# Patient Record
Sex: Male | Born: 1980 | Hispanic: Yes | Marital: Married | Smoking: Never smoker
Health system: Southern US, Community
[De-identification: ages and names within clinical notes are randomized; demographics above are authoritative.]

---

## 2016-08-10 ENCOUNTER — Ambulatory Visit (HOSPITAL_COMMUNITY)
Admission: EM | Admit: 2016-08-10 | Discharge: 2016-08-10 | Disposition: A | Discharge status: Home / Self Care | Payer: Self-pay | Attending: Emergency Medicine | Admitting: Emergency Medicine

## 2016-08-10 ENCOUNTER — Ambulatory Visit (INDEPENDENT_AMBULATORY_CARE_PROVIDER_SITE_OTHER): Payer: Self-pay

## 2016-08-10 ENCOUNTER — Encounter (HOSPITAL_COMMUNITY): Payer: Self-pay | Admitting: Emergency Medicine

## 2016-08-10 DIAGNOSIS — S61312A Laceration without foreign body of right middle finger with damage to nail, initial encounter: Secondary | ICD-10-CM

## 2016-08-10 MED ORDER — IBUPROFEN 800 MG PO TABS: 800.0000 mg | ORAL_TABLET | Freq: Three times a day (TID) | ORAL | 0 refills | Status: AC

## 2016-08-10 MED ORDER — HYDROCODONE-ACETAMINOPHEN 5-325 MG PO TABS: 1.0000 | ORAL_TABLET | ORAL | 0 refills | Status: DC | PRN

## 2016-08-10 MED ORDER — CEPHALEXIN 500 MG PO CAPS: 500.0000 mg | ORAL_CAPSULE | Freq: Four times a day (QID) | ORAL | 0 refills | Status: DC

## 2016-08-10 NOTE — ED Triage Notes (Signed)
Pt reports lac to 3rd digit on right hand  Reports he was cutting wood w/table saw  Last tetanus was last year   Last had aleve 220 mg x4 today at 1100  A&O x4... NAD

## 2016-08-10 NOTE — ED Notes (Signed)
Wound cleaned   And  Dressed

## 2016-08-10 NOTE — ED Provider Notes (Signed)
CSN: [id number]     Arrival date & time 08/10/16  1230 History   First MD Initiated Contact with Patient 08/10/16 1404     Chief Complaint  Patient presents with  . Extremity Laceration   (Consider location/radiation/quality/duration/timing/severity/associated sxs/prior Treatment) 36 year old male was working with a moderate salt and his right third fingertip was called and the blade producing a 2.5 cm laceration across the pulp of the finger involving a small portion of the nail. This occurred approximately 11:00 this morning lasting shot was one year ago.      History reviewed. No pertinent past medical history. History reviewed. No pertinent surgical history. History reviewed. No pertinent family history. Social History  Substance Use Topics  . Smoking status: Never Smoker  . Smokeless tobacco: Never Used  . Alcohol use Yes    Review of Systems  Constitutional: Negative.   HENT: Negative.   Skin: Positive for wound.  Neurological: Negative.   All other systems reviewed and are negative.   Allergies  Patient has no known allergies.  Home Medications   Prior to Admission medications   Medication Sig Start Date End Date Taking? Authorizing Provider  cephALEXin (KEFLEX) 500 MG capsule Take 1 capsule (500 mg total) by mouth 4 (four) times daily. 08/10/16   Hayden Rasmussenavid Keaisha Sublette, NP  HYDROcodone-acetaminophen (NORCO/VICODIN) 5-325 MG tablet Take 1 tablet by mouth every 4 (four) hours as needed. 08/10/16   Hayden Rasmussenavid Ilija Maxim, NP  ibuprofen (ADVIL,MOTRIN) 800 MG tablet Take 1 tablet (800 mg total) by mouth 3 (three) times daily. 08/10/16   Hayden Rasmussenavid Shawanna Zanders, NP   Meds Ordered and Administered this Visit  Medications - No data to display  BP 104/69 (BP Location: Left Arm)   Pulse 67   Temp 97.7 F (36.5 C) (Oral)   Resp 18   SpO2 97%  No data found.   Physical Exam  Constitutional: He is oriented to person, place, and time. He appears well-developed and well-nourished. No distress.  Neck: Normal  range of motion. Neck supple.  Cardiovascular: Normal rate.   Pulmonary/Chest: Effort normal.  Musculoskeletal:  Right middle finger with jagged laceration to the distal phalanx involving approximately 1/5 of the nail. The laceration is approximately 2-2-1/2 cm. It is jagged and produced several tears to the skin margins as well as the underlying adipose tissue. No apparent injury to the tendons. Flexion and extension is intact. It does not involve the joint space. Distal sensitivity is intact. Bleeding is minimal.  Neurological: He is alert and oriented to person, place, and time.  Skin: Skin is warm and dry.  Nursing note and vitals reviewed.   Urgent Care Course     .Marland Kitchen.Laceration Repair Date/Time: 08/10/2016 3:50 PM Performed by: Phineas RealMABE, Trystin Hargrove Authorized by: Charm RingsHONIG, ERIN J   Consent:    Consent obtained:  Verbal   Consent given by:  Patient   Risks discussed:  Infection, pain and nerve damage Anesthesia (see MAR for exact dosages):    Anesthesia method:  Nerve block   Block location:  Digital   Block needle gauge:  25 G   Block anesthetic:  Lidocaine 1% w/o epi and bupivacaine 0.25% w/o epi   Block technique:  Digital block base of digit   Block injection procedure:  Anatomic landmarks identified, introduced needle, incremental injection and negative aspiration for blood   Block outcome:  Anesthesia achieved Laceration details:    Location:  Finger   Finger location:  R long finger   Length (cm):  2.4  Depth (mm):  4 Repair type:    Repair type:  Intermediate Pre-procedure details:    Preparation:  Patient was prepped and draped in usual sterile fashion Exploration:    Hemostasis achieved with:  Direct pressure   Wound exploration: entire depth of wound probed and visualized     Wound extent: no foreign bodies/material noted, no muscle damage noted, no tendon damage noted and no underlying fracture noted     Contaminated: no   Treatment:    Area cleansed with:  Betadine and  saline   Amount of cleaning:  Extensive   Irrigation solution:  Sterile saline   Irrigation method:  Syringe Skin repair:    Repair method:  Sutures   Suture size:  4-0   Suture material:  Nylon   Suture technique:  Simple interrupted   Number of sutures:  4 Approximation:    Approximation:  Close   Vermilion border: well-aligned   Post-procedure details:    Dressing:  Antibiotic ointment and bulky dressing   Patient tolerance of procedure:  Tolerated well, no immediate complications Comments:     Jagged irregular laceration requiring debridement, realignment extensive scrubbing and cleaning, partial adipose removal.   (including critical care time)  Labs Review Labs Reviewed - No data to display  Imaging Review Dg Hand Complete Right  Result Date: 08/10/2016 CLINICAL DATA:  Initial evaluation for acute trauma, at right hand on sella. Wound at distal aspect of third digit. EXAM: RIGHT HAND - COMPLETE 3+ VIEW COMPARISON:  None. FINDINGS: Soft tissue irregularity present at the radial aspect of the distal third digit, compatible with laceration. No retained radiopaque foreign body. No acute fracture or other osseous abnormality within the underlying third digit. Remainder of the bones and soft tissues of the right hand within normal limits. No other acute soft tissue abnormality. IMPRESSION: Soft tissue laceration at the distal aspect of the right third digit. No retained radiopaque foreign body. No acute fracture or other osseous abnormality within the underlying third digit. Electronically Signed   By: Rise Mu M.D.   On: 08/10/2016 14:22     Visual Acuity Review  Right Eye Distance:   Left Eye Distance:   Bilateral Distance:    Right Eye Near:   Left Eye Near:    Bilateral Near:         MDM   1. Laceration of right middle finger without foreign body with damage to nail, initial encounter    Keep the wound clean and dry. You may wash with one half water and  one half peroxide daily. Watch for any signs of infection. If you see any signs of infection return to the urgent care immediately. Have the sutures removed in 8-9 days. Take the antibiotics as directed. I will use the antibiotic ointment for one day. Meds ordered this encounter  Medications  . cephALEXin (KEFLEX) 500 MG capsule    Sig: Take 1 capsule (500 mg total) by mouth 4 (four) times daily.    Dispense:  16 capsule    Refill:  0    Order Specific Question:   Supervising Provider    Answer:   Charm Rings [id number]  . ibuprofen (ADVIL,MOTRIN) 800 MG tablet    Sig: Take 1 tablet (800 mg total) by mouth 3 (three) times daily.    Dispense:  21 tablet    Refill:  0    Order Specific Question:   Supervising Provider    Answer:   Charm Rings [  [id number]]  . HYDROcodone-acetaminophen (NORCO/VICODIN) 5-325 MG tablet    Sig: Take 1 tablet by mouth every 4 (four) hours as needed.    Dispense:  12 tablet    Refill:  0    Order Specific Question:   Supervising Provider    Answer:   Micheline Chapman       Hayden Rasmussen, NP 08/10/16 1554

## 2016-08-10 NOTE — Discharge Instructions (Signed)
Keep the wound clean and dry. You may wash with one half water and one half peroxide daily. Watch for any signs of infection. If you see any signs of infection return to the urgent care immediately. Have the sutures removed in 8-9 days. Take the antibiotics as directed. I will use the antibiotic ointment for one day.

## 2017-06-30 IMAGING — DX DG HAND COMPLETE 3+V*R*
3 series · 3 of 3 positions shown · non-contrast
Comparison: None.

CLINICAL DATA: Initial evaluation for acute trauma, at right hand
on sella. Wound at distal aspect of third digit.

EXAM:
RIGHT HAND - COMPLETE 3+ VIEW

[hand pa]
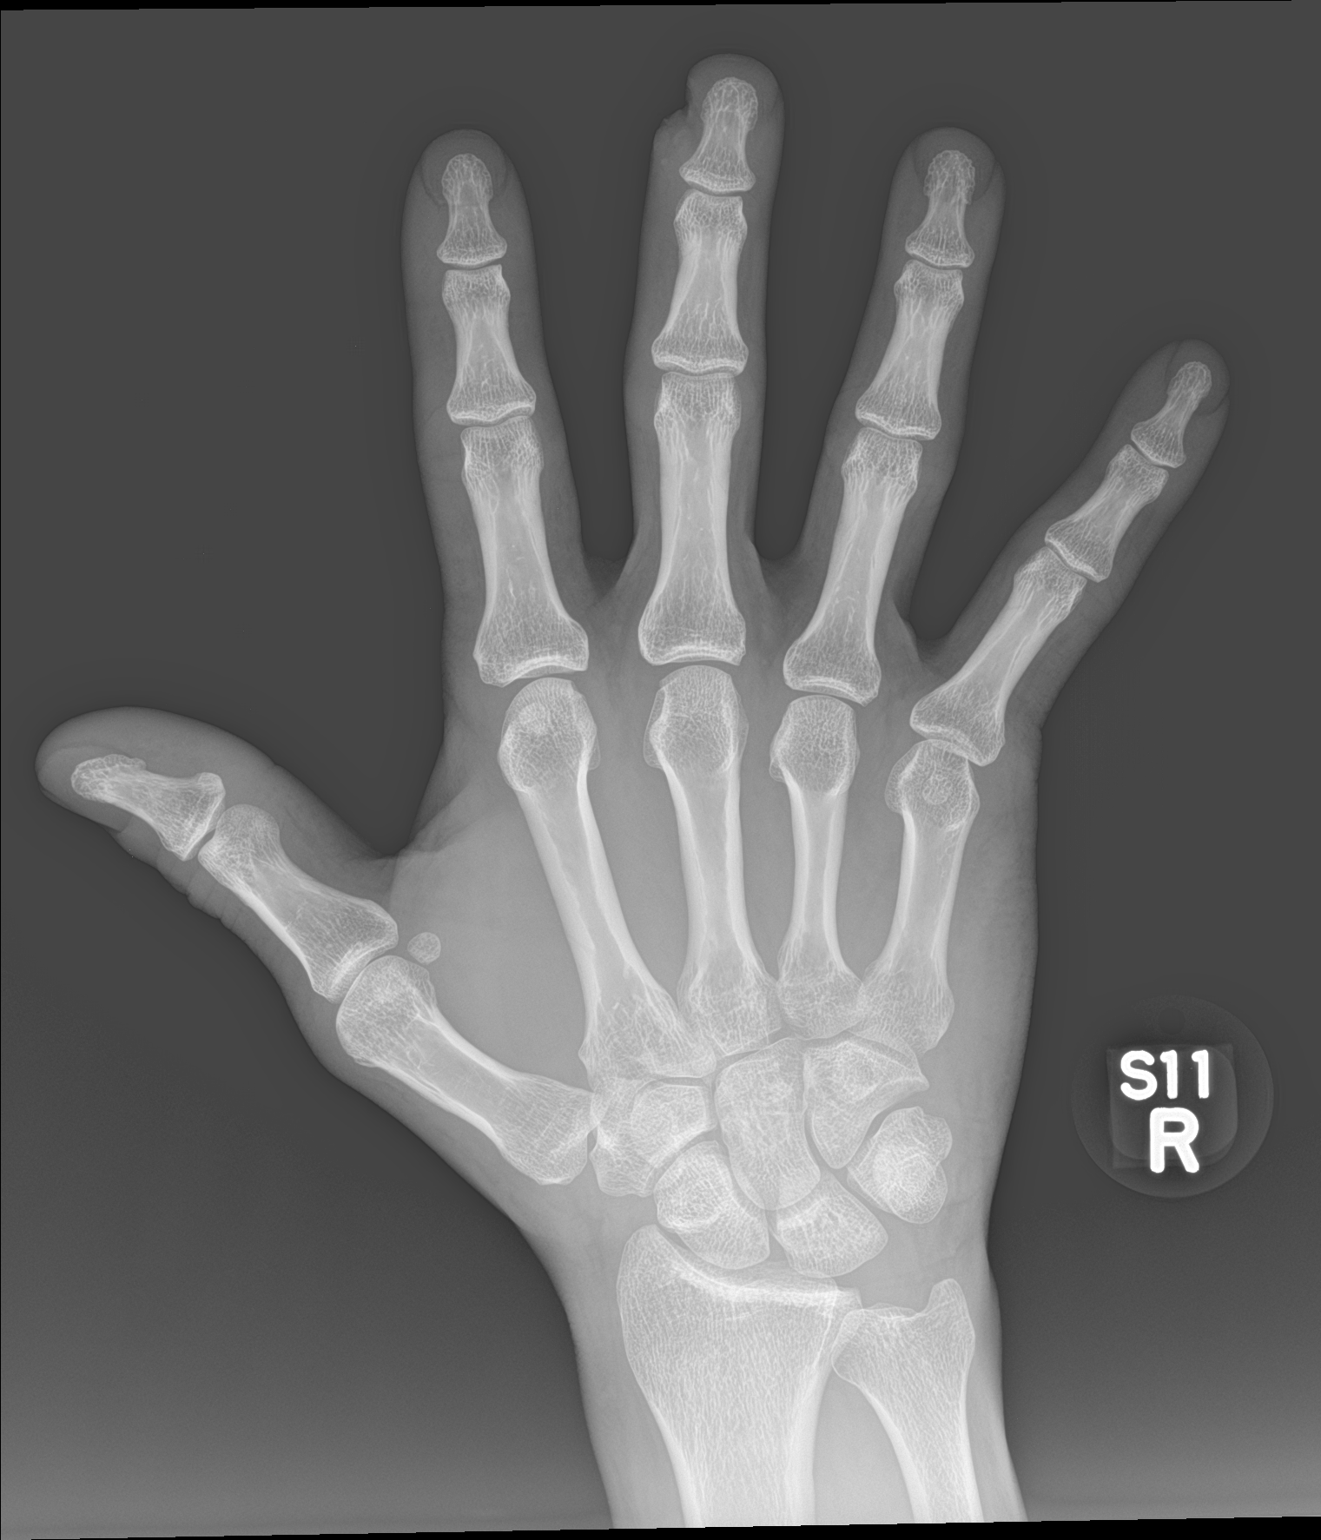

[hand obl]
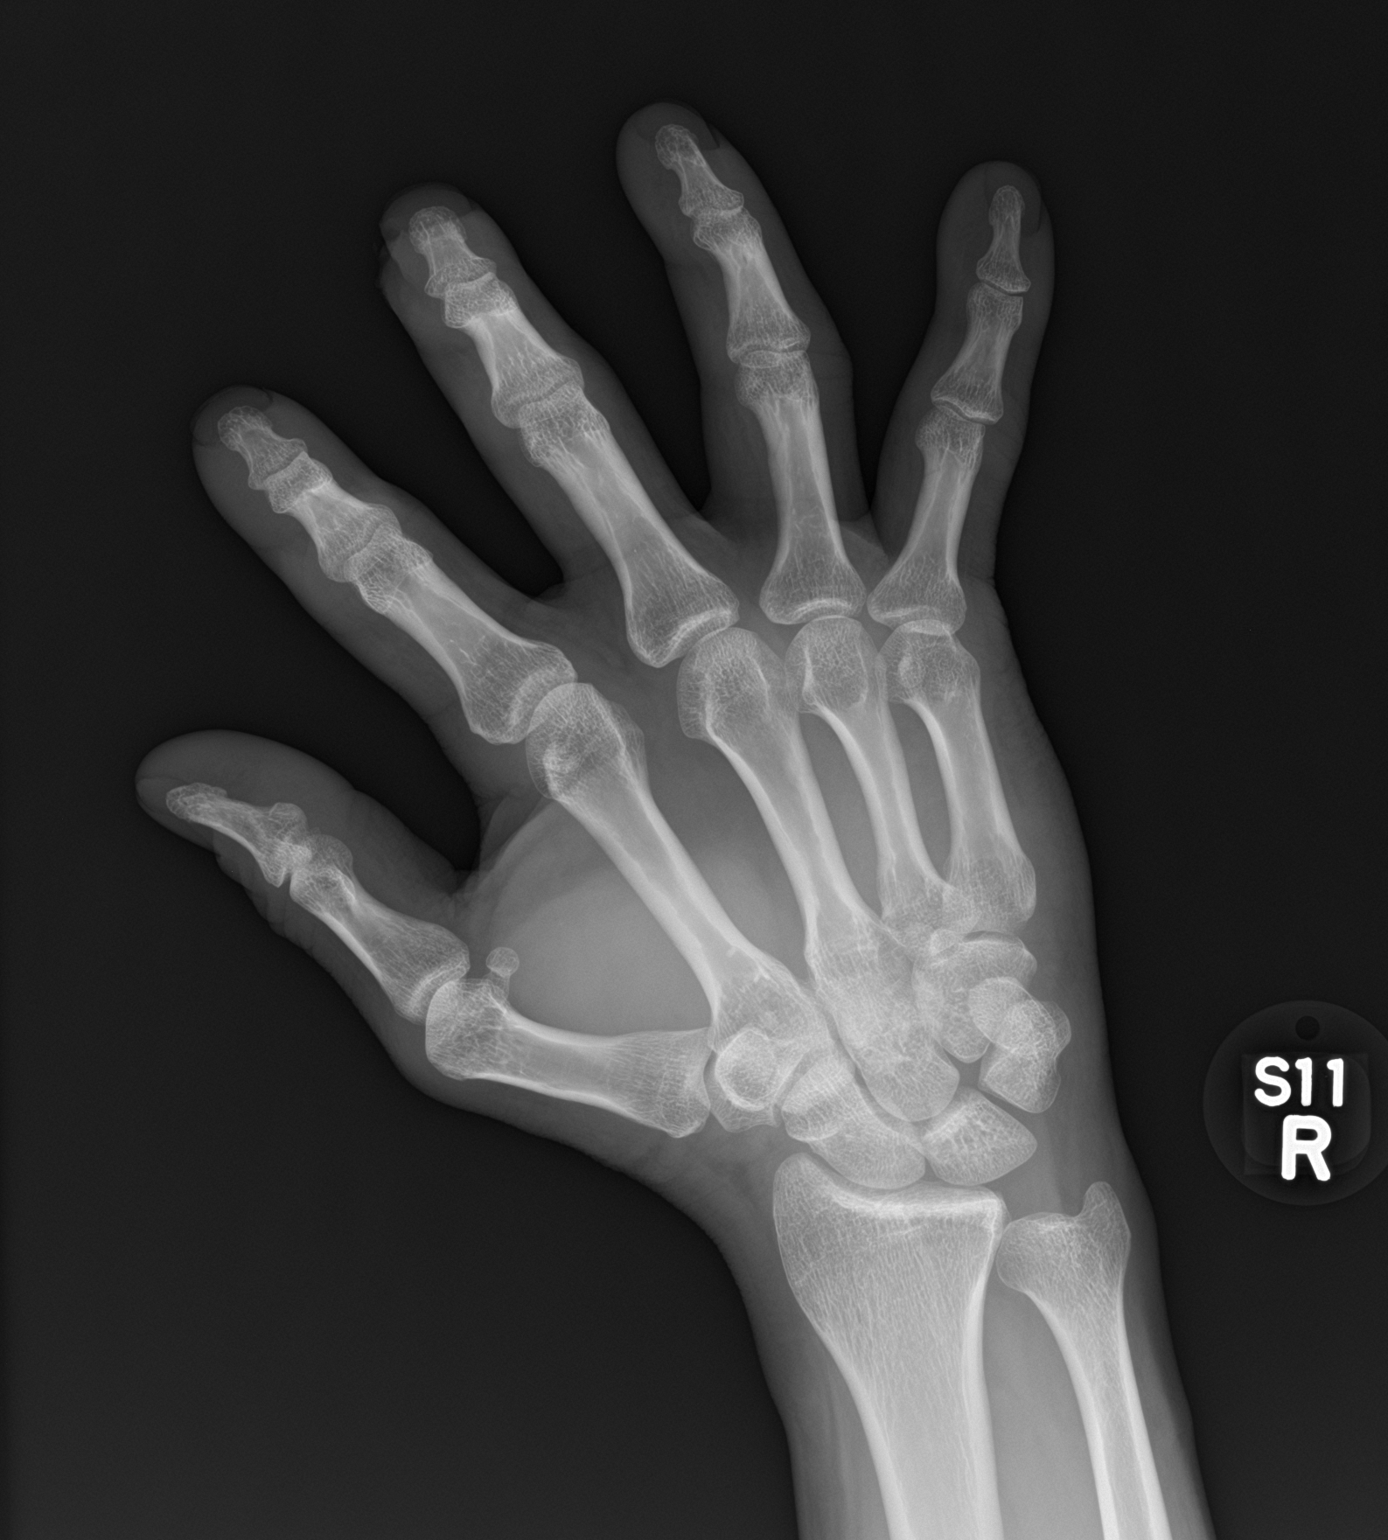

[hand lat]
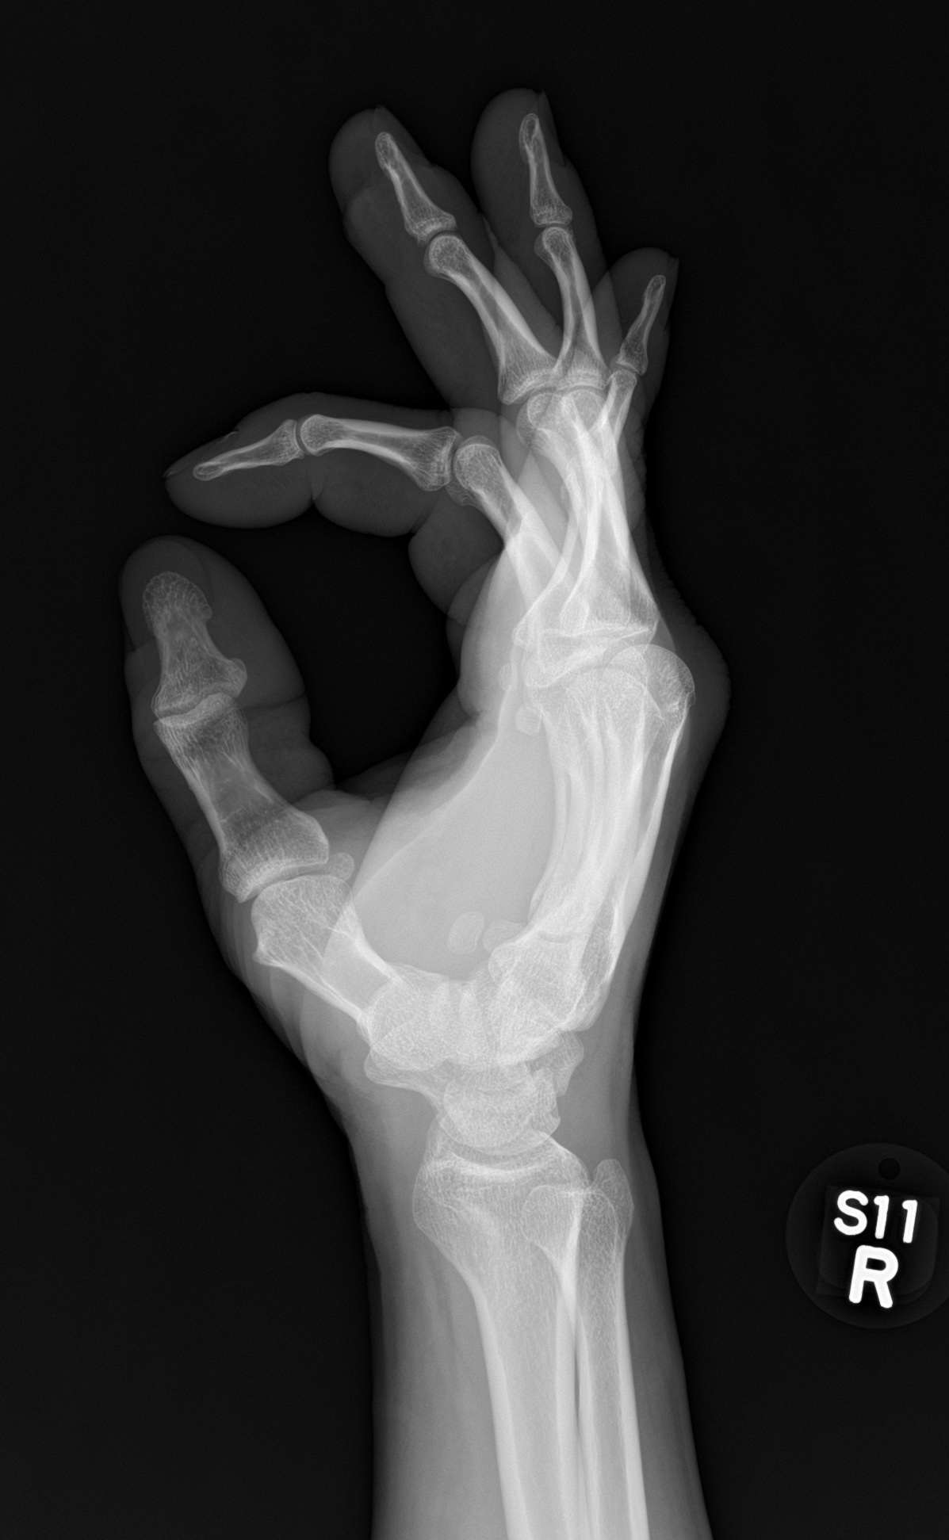

[3 of 3 positions shown; findings below may reference images not displayed]

FINDINGS: Soft tissue irregularity present at the radial aspect of the distal
third digit, compatible with laceration. No retained radiopaque
foreign body. No acute fracture or other osseous abnormality within
the underlying third digit. Remainder of the bones and soft tissues
of the right hand within normal limits. No other acute soft tissue
abnormality.
IMPRESSION: Soft tissue laceration at the distal aspect of the right third
digit. No retained radiopaque foreign body. No acute fracture or
other osseous abnormality within the underlying third digit.

## 2023-12-05 ENCOUNTER — Encounter (HOSPITAL_BASED_OUTPATIENT_CLINIC_OR_DEPARTMENT_OTHER): Payer: Self-pay

## 2023-12-05 ENCOUNTER — Ambulatory Visit (HOSPITAL_BASED_OUTPATIENT_CLINIC_OR_DEPARTMENT_OTHER)
Admission: EM | Admit: 2023-12-05 | Discharge: 2023-12-05 | Disposition: A | Discharge status: Home / Self Care | Payer: Self-pay | Attending: Family Medicine | Admitting: Family Medicine

## 2023-12-05 DIAGNOSIS — R42 Dizziness and giddiness: Secondary | ICD-10-CM

## 2023-12-05 MED ORDER — ONDANSETRON 4 MG PO TBDP
4.0000 mg | ORAL_TABLET | Freq: Once | ORAL | Status: AC
  Administered 2023-12-05: 4 mg via ORAL

## 2023-12-05 MED ORDER — ONDANSETRON 4 MG PO TBDP: 4.0000 mg | ORAL_TABLET | Freq: Three times a day (TID) | ORAL | 0 refills | Status: AC | PRN

## 2023-12-05 MED ORDER — MECLIZINE HCL 25 MG PO TABS
25.0000 mg | ORAL_TABLET | Freq: Once | ORAL | Status: AC
  Administered 2023-12-05: 25 mg via ORAL

## 2023-12-05 MED ORDER — MECLIZINE HCL 25 MG PO TABS: 25.0000 mg | ORAL_TABLET | Freq: Three times a day (TID) | ORAL | 0 refills | Status: AC | PRN

## 2023-12-05 NOTE — Discharge Instructions (Addendum)
 Most likely your symptoms are related to vertigo.  I have sent some information about that and here.  We have given you meclizine here today to help with the dizziness and Zofran for nausea I am also sending prescriptions to your pharmacy to use as needed. If your symptoms worsen please go to the ER otherwise follow-up with your regular doctor as needed

## 2023-12-05 NOTE — ED Triage Notes (Signed)
 States was awakened by dizziness when rolling out of bed. States the dizziness is causing extreme nausea. Reports when trying to close eyes and rest. When opening eyes, felt as if eyes were darting back and forth. States symptoms worse when turning head to the right.

## 2023-12-06 NOTE — ED Provider Notes (Signed)
 [facility]    CSN: [id number] Arrival date & time: 12/05/23  1907      History   Chief Complaint Chief Complaint  Patient presents with   Dizziness    HPI Mario Hall is a 43 y.o. male.   Pt is a 43 year old male that presents with dizziness and vertigo type symptoms.  Reports that he woke up with this this morning when getting out of bed.  He rolled over and felt like the room was spinning.  He then proceeded to have some extreme nausea associated with this. Some problems focusing with eyes at times.  The problem has been coming and going all day and changing in intensity.  He was able to eat but did feel nauseous after eating.  Denies any headache, blurry vision.  Worse when turning the head back-and-forth.  No history of similar in the past.  No fever, chills, nasal congestion, cough, rhinorrhea.   Dizziness   History reviewed. No pertinent past medical history.  There are no active problems to display for this patient.   History reviewed. No pertinent surgical history.     Home Medications    Prior to Admission medications   Medication Sig Start Date End Date Taking? Authorizing Provider  meclizine  (ANTIVERT ) 25 MG tablet Take 1 tablet (25 mg total) by mouth 3 (three) times daily as needed for dizziness. 12/05/23  Yes Tuyet Bader A, FNP  ondansetron  (ZOFRAN -ODT) 4 MG disintegrating tablet Take 1 tablet (4 mg total) by mouth every 8 (eight) hours as needed for nausea or vomiting. 12/05/23  Yes Maili Shutters A, FNP  ibuprofen  (ADVIL ,MOTRIN ) 800 MG tablet Take 1 tablet (800 mg total) by mouth 3 (three) times daily. 08/10/16   Garrie Kand, NP    Family History History reviewed. No pertinent family history.  Social History Social History   Tobacco Use   Smoking status: Never   Smokeless tobacco: Never  Substance Use Topics   Alcohol use: Yes     Allergies   Patient has no known allergies.   Review of Systems Review of Systems  Neurological:   Positive for dizziness.   See HPI  Physical Exam Triage Vital Signs ED Triage Vitals  Encounter Vitals Group     BP 12/05/23 1921 121/79     Systolic BP Percentile --      Diastolic BP Percentile --      Pulse Rate 12/05/23 1921 64     Resp 12/05/23 1921 20     Temp 12/05/23 1921 99.5 F (37.5 C)     Temp Source 12/05/23 1921 Oral     SpO2 12/05/23 1921 98 %     Weight --      Height --      Head Circumference --      Peak Flow --      Pain Score 12/05/23 1922 0     Pain Loc --      Pain Education --      Exclude from Growth Chart --    No data found.  Updated Vital Signs BP 121/79 (BP Location: Right Arm)   Pulse 64   Temp 99.5 F (37.5 C) (Oral)   Resp 20   SpO2 98%   Visual Acuity Right Eye Distance:   Left Eye Distance:   Bilateral Distance:    Right Eye Near:   Left Eye Near:    Bilateral Near:     Physical Exam Vitals and nursing note reviewed.  Constitutional:      Appearance: Normal appearance.  HENT:     Right Ear: Tympanic membrane, ear canal and external ear normal.     Left Ear: Tympanic membrane, ear canal and external ear normal.     Nose: Nose normal.     Mouth/Throat:     Pharynx: Oropharynx is clear.  Eyes:     General: No visual field deficit.    Extraocular Movements:     Right eye: Nystagmus present.     Left eye: Nystagmus present.     Conjunctiva/sclera: Conjunctivae normal.  Cardiovascular:     Rate and Rhythm: Normal rate and regular rhythm.     Pulses: Normal pulses.     Heart sounds: Normal heart sounds.  Pulmonary:     Effort: Pulmonary effort is normal.     Breath sounds: Normal breath sounds.  Musculoskeletal:        General: Normal range of motion.  Skin:    General: Skin is warm and dry.  Neurological:     General: No focal deficit present.     Mental Status: He is alert.     Cranial Nerves: No cranial nerve deficit, dysarthria or facial asymmetry.     Motor: No weakness.     Gait: Gait normal.      UC  Treatments / Results  Labs (all labs ordered are listed, but only abnormal results are displayed) Labs Reviewed - No data to display  EKG   Radiology No results found.  Procedures Procedures (including critical care time)  Medications Ordered in UC Medications  meclizine (ANTIVERT) tablet 25 mg (25 mg Oral Given 12/05/23 1938)  ondansetron (ZOFRAN-ODT) disintegrating tablet 4 mg (4 mg Oral Given 12/05/23 1939)    Initial Impression / Assessment and Plan / UC Course  I have reviewed the triage vital signs and the nursing notes.  Pertinent labs & imaging results that were available during my care of the patient were reviewed by me and considered in my medical decision making (see chart for details).     Vertigo-exam and symptoms consistent with this.  No other concern for intracranial abnormality or CVA at this time Meclizine and Zofran given here for symptoms A lot of information given in packet about vertigo and things to avoid and performing  the Epley maneuver.  Prescription sent to the pharmacy to use as needed for further symptoms.  Recommend if symptoms continue he will need to follow-up with his PCP but if he becomes worse he will need to go to the ER. Patient understanding and agree. Final Clinical Impressions(s) / UC Diagnoses   Final diagnoses:  Vertigo   Discharge Instructions      Most likely your symptoms are related to vertigo.  I have sent some information about that and here.  We have given you meclizine here today to help with the dizziness and Zofran for nausea I am also sending prescriptions to your pharmacy to use as needed. If your symptoms worsen please go to the ER otherwise follow-up with your regular doctor as needed  ED Prescriptions     Medication Sig Dispense Auth. Provider   meclizine (ANTIVERT) 25 MG tablet Take 1 tablet (25 mg total) by mouth 3 (three) times daily as needed for dizziness. 30 tablet Vicke Plotner A, FNP   ondansetron (ZOFRAN-ODT)  4 MG disintegrating tablet Take 1 tablet (4 mg total) by mouth every 8 (eight) hours as needed for nausea or vomiting. 20 tablet Jamee Keach A,  FNP      PDMP not reviewed this encounter.   Landa Pine, FNP 12/06/23 [phone]
